# Patient Record
Sex: Female | Born: 2000
Health system: Southern US, Community
[De-identification: ages and names within clinical notes are randomized; demographics above are authoritative.]

## PROBLEM LIST (undated history)

## (undated) DIAGNOSIS — J45909 Unspecified asthma, uncomplicated: Secondary | ICD-10-CM

---
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
## 2022-04-22 IMAGING — US US ABDOMEN COMPLETE
1 series · 14 of 25 positions shown · non-contrast
Comparison: None.

CLINICAL DATA: Back pain, nausea and vomiting.

EXAM:
ABDOMEN ULTRASOUND COMPLETE

[Series 1: us abdomen complete · 14 of 62 slices shown]
[im 1/62]
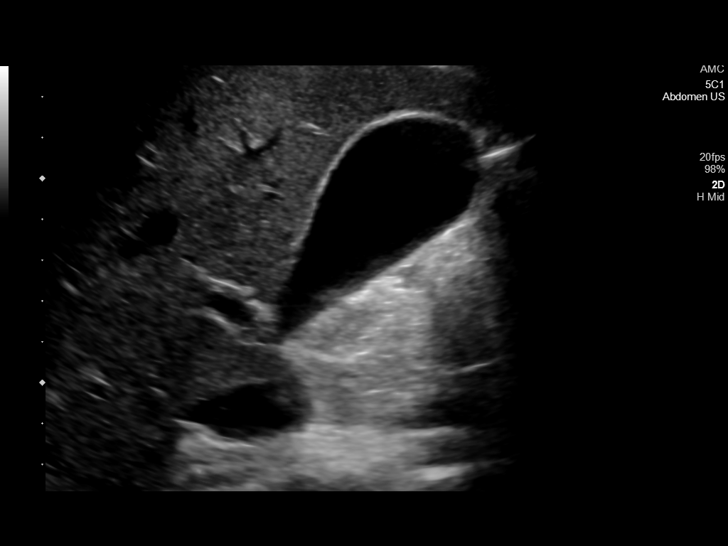
[im 6/62]
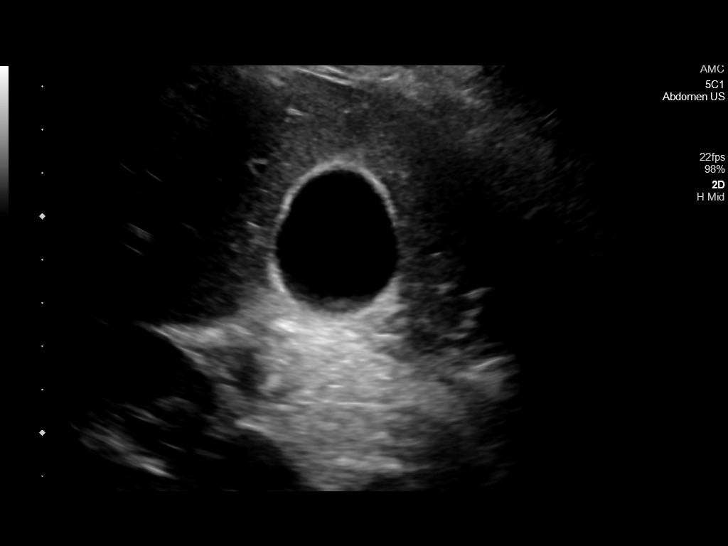
[im 11/62]
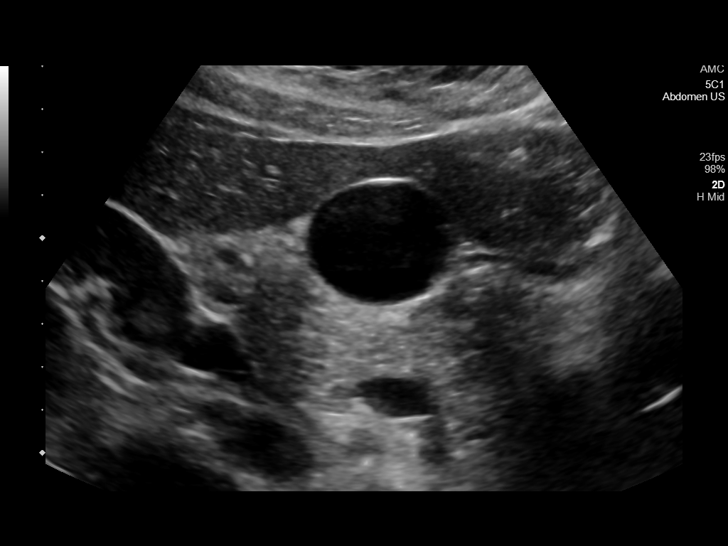
[im 16/62]
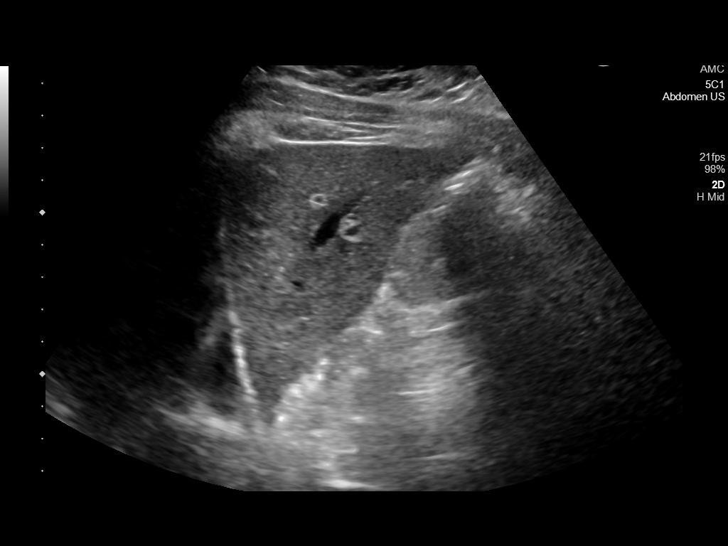
[im 21/62]
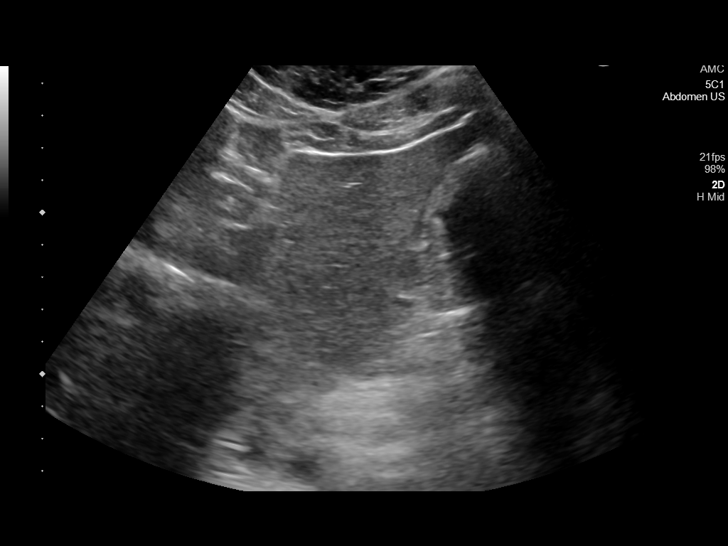
[im 23/62]
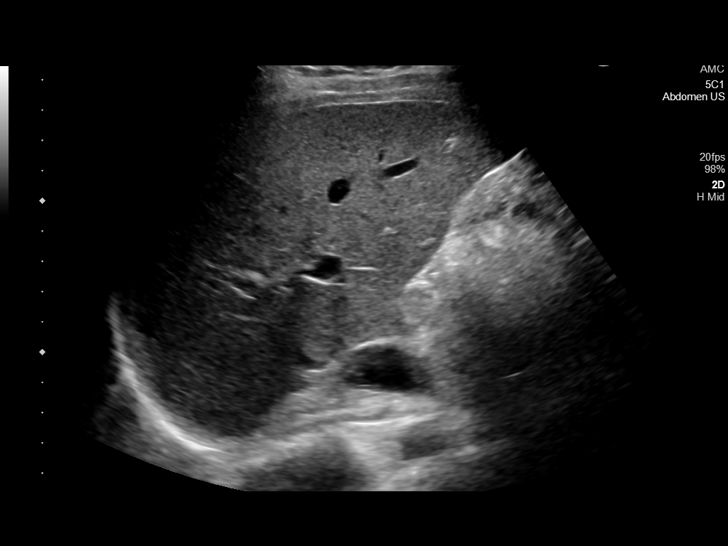
[im 28/62]
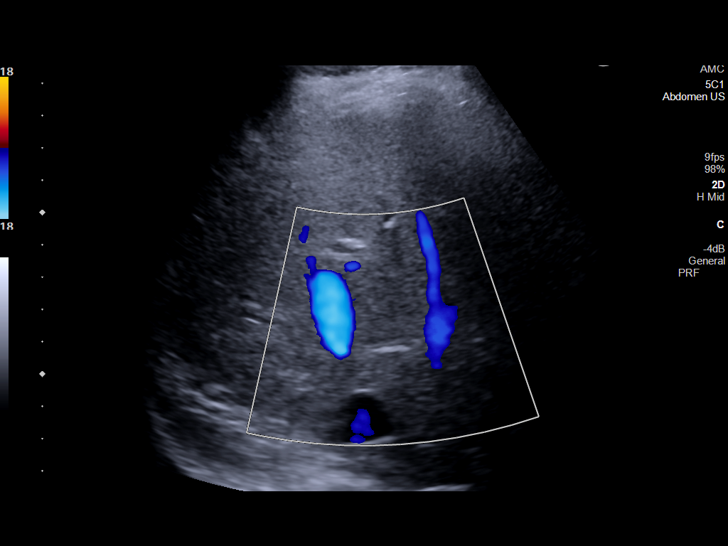
[im 34/62]
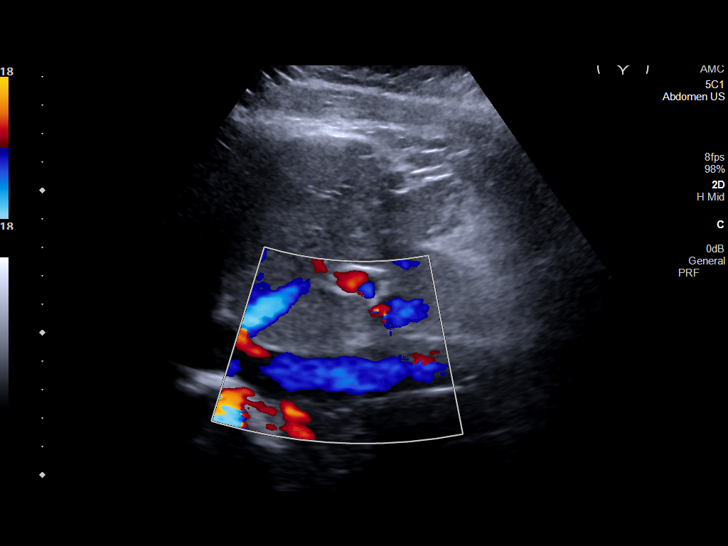
[im 39/62]
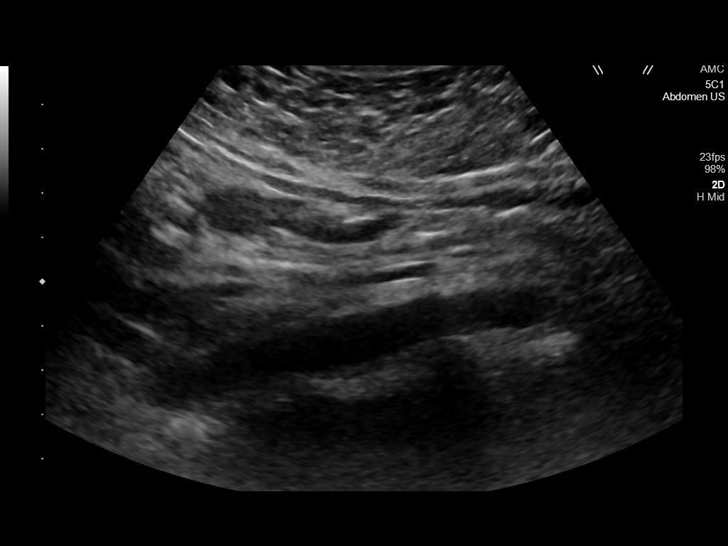
[im 41/62]
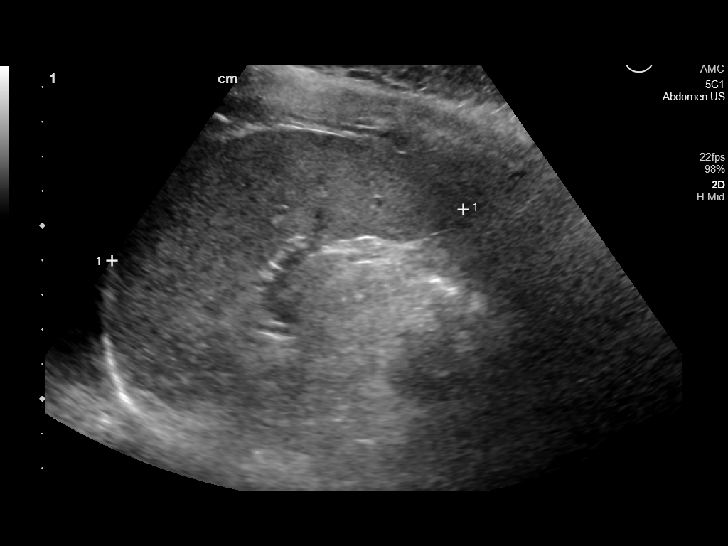
[im 46/62]
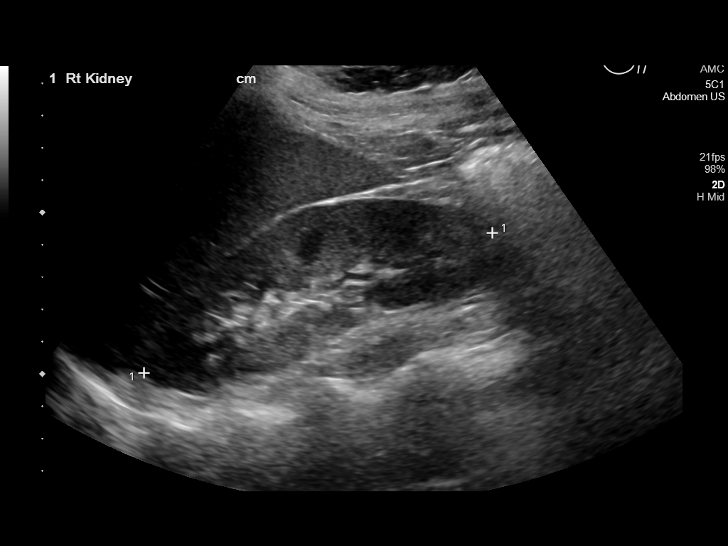
[im 51/62]
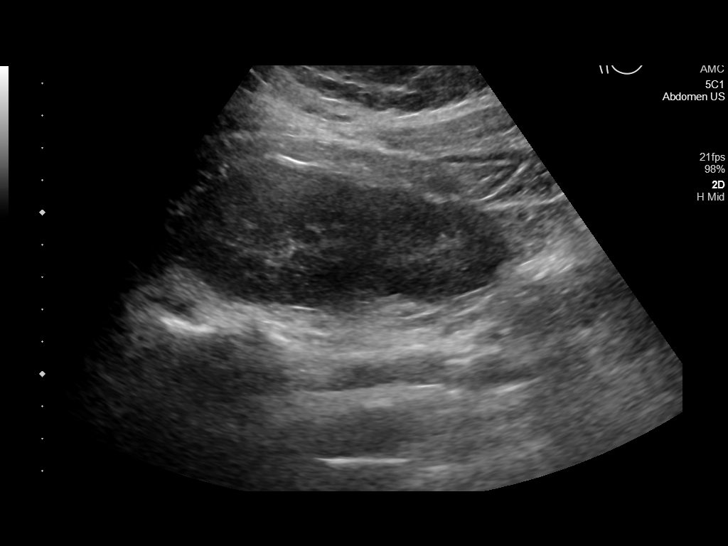
[im 56/62]
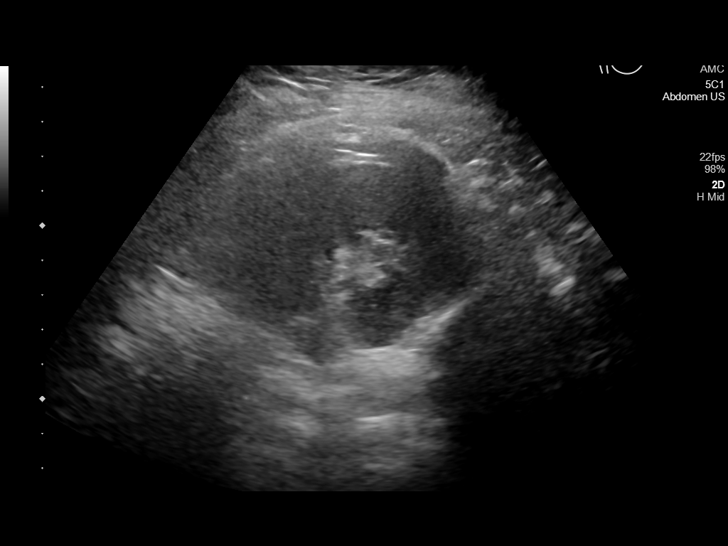
[im 62/62]
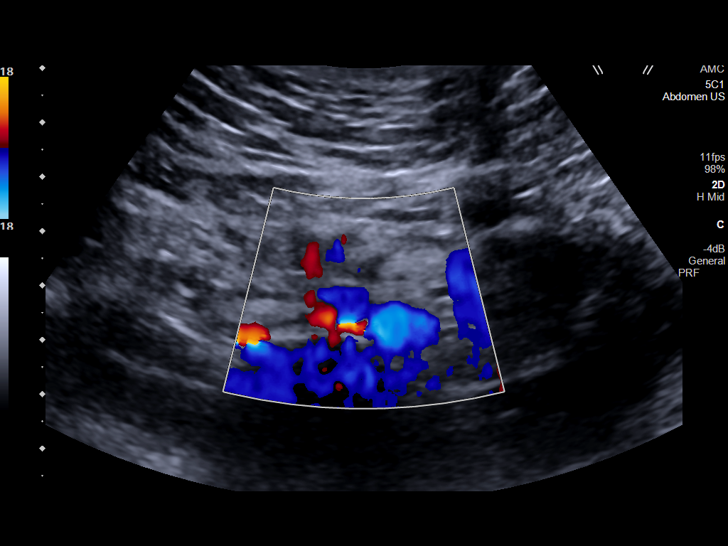

[14 of 25 positions shown; findings below may reference images not displayed]

FINDINGS: Gallbladder: No gallstones or wall thickening visualized. No
sonographic Murphy sign noted by sonographer.

Common bile duct: Diameter: 3 mm

Liver: No focal lesion identified. Within normal limits in
parenchymal echogenicity. Portal vein is patent on color Doppler
imaging with normal direction of blood flow towards the liver.

IVC: No abnormality visualized.

Pancreas: Visualized portion unremarkable.

Spleen: Size and appearance within normal limits.

Right Kidney: Length: 11.6 cm. Echogenicity within normal limits. No
mass or hydronephrosis visualized.

Left Kidney: Length: 10.8 cm. Echogenicity within normal limits. No
mass or hydronephrosis visualized.

Abdominal aorta: No aneurysm visualized.

Other findings: None.
IMPRESSION: Normal abdomen ultrasound.

## 2022-10-27 ENCOUNTER — Emergency Department
Admission: EM | Admit: 2022-10-27 | Discharge: 2022-10-28 | Disposition: A | Discharge status: Home / Self Care | Payer: 59 | Attending: Emergency Medicine | Admitting: Emergency Medicine

## 2022-10-27 ENCOUNTER — Encounter: Payer: Self-pay | Admitting: Emergency Medicine

## 2022-10-27 ENCOUNTER — Other Ambulatory Visit: Payer: Self-pay

## 2022-10-27 DIAGNOSIS — G8929 Other chronic pain: Secondary | ICD-10-CM | POA: Diagnosis not present

## 2022-10-27 DIAGNOSIS — R109 Unspecified abdominal pain: Secondary | ICD-10-CM | POA: Insufficient documentation

## 2022-10-27 DIAGNOSIS — R197 Diarrhea, unspecified: Secondary | ICD-10-CM | POA: Diagnosis not present

## 2022-10-27 DIAGNOSIS — R112 Nausea with vomiting, unspecified: Secondary | ICD-10-CM | POA: Insufficient documentation

## 2022-10-27 LAB — CBC
HCT: 42.4 % (ref 36.0–46.0)
Hemoglobin: 14.2 g/dL (ref 12.0–15.0)
MCH: 30.5 pg (ref 26.0–34.0)
MCHC: 33.5 g/dL (ref 30.0–36.0)
MCV: 91.2 fL (ref 80.0–100.0)
Platelets: 172 10*3/uL (ref 150–400)
RBC: 4.65 MIL/uL (ref 3.87–5.11)
RDW: 11.9 % (ref 11.5–15.5)
WBC: 6.5 10*3/uL (ref 4.0–10.5)
nRBC: 0 % (ref 0.0–0.2)

## 2022-10-27 LAB — URINALYSIS, ROUTINE W REFLEX MICROSCOPIC
Bilirubin Urine: NEGATIVE
Glucose, UA: NEGATIVE mg/dL
Hgb urine dipstick: NEGATIVE
Ketones, ur: 5 mg/dL — AB
Leukocytes,Ua: NEGATIVE
Nitrite: NEGATIVE
Protein, ur: 30 mg/dL — AB
Specific Gravity, Urine: 1.028 (ref 1.005–1.030)
pH: 6 (ref 5.0–8.0)

## 2022-10-27 LAB — COMPREHENSIVE METABOLIC PANEL
ALT: 16 U/L (ref 0–44)
AST: 24 U/L (ref 15–41)
Albumin: 4.9 g/dL (ref 3.5–5.0)
Alkaline Phosphatase: 45 U/L (ref 38–126)
Anion gap: 9 (ref 5–15)
BUN: 14 mg/dL (ref 6–20)
CO2: 24 mmol/L (ref 22–32)
Calcium: 9.7 mg/dL (ref 8.9–10.3)
Chloride: 102 mmol/L (ref 98–111)
Creatinine, Ser: 0.6 mg/dL (ref 0.44–1.00)
GFR, Estimated: >60 mL/min (ref >60)
Glucose, Bld: 98 mg/dL (ref 70–99)
Potassium: 3.8 mmol/L (ref 3.5–5.1)
Sodium: 135 mmol/L (ref 135–145)
Total Bilirubin: 1 mg/dL (ref 0.3–1.2)
Total Protein: 8.1 g/dL (ref 6.5–8.1)

## 2022-10-27 LAB — LIPASE, BLOOD: Lipase: 67 U/L — ABNORMAL HIGH (ref 11–51)

## 2022-10-27 LAB — POC URINE PREG, ED: Preg Test, Ur: NEGATIVE

## 2022-10-27 MED ORDER — ONDANSETRON 4 MG PO TBDP: 4.0000 mg | ORAL_TABLET | Freq: Once | ORAL | Status: DC | PRN

## 2022-10-27 NOTE — ED Triage Notes (Signed)
Pt in with LLQ abdominal pain, ongoing x 3 mo's but worsened today. States she also has been having increased episodes of vomiting/diarrhea today. She's scheduled for EDG and colonoscopy on 2/9, but pain is unbearable in the meantime. Reports seeing small amounts of BRB in stool.

## 2022-10-28 MED ORDER — METOCLOPRAMIDE HCL 10 MG PO TABS: 10.0000 mg | ORAL_TABLET | Freq: Three times a day (TID) | ORAL | 0 refills | Status: DC | PRN

## 2022-10-28 MED ORDER — DICYCLOMINE HCL 10 MG PO CAPS
10.0000 mg | ORAL_CAPSULE | Freq: Once | ORAL | Status: AC
  Administered 2022-10-28: 10 mg via ORAL
  Filled 2022-10-28: qty 1

## 2022-10-28 MED ORDER — METOCLOPRAMIDE HCL 10 MG PO TABS
10.0000 mg | ORAL_TABLET | Freq: Once | ORAL | Status: AC
  Administered 2022-10-28: 10 mg via ORAL
  Filled 2022-10-28: qty 1

## 2022-10-28 MED ORDER — DICYCLOMINE HCL 10 MG PO CAPS: 10.0000 mg | ORAL_CAPSULE | Freq: Three times a day (TID) | ORAL | 0 refills | Status: AC | PRN

## 2022-10-28 NOTE — ED Provider Notes (Signed)
[facility] Provider Note    Event Date/Time   First MD Initiated Contact with Patient 10/27/22 2358     (approximate)   History   Abdominal Pain   HPI  Brittany Robinson is a 22 y.o. female with history of chronic abdominal pain who presents for evaluation of worsening episodes of abdominal pain, nausea, vomiting, and diarrhea over the last few days.  She says she has been dealing with this since she was a child.  Her mother is with her at bedside and confirms.  She said that sometimes it just gets to be too much so she came to the emergency department.  She is established care with Dr. Virgina Jock with gastroenterology and has a colonoscopy and endoscopy scheduled in a few weeks.  She is not currently taking any medications other than over-the-counter treatment for her abdominal symptoms.  She is having no pain when she urinates and no chest pain or shortness of breath.  The pain feels similar to the way it always feels, which is both pressure and sharp pain.     Physical Exam   Triage Vital Signs: ED Triage Vitals [10/27/22 2135]  Enc Vitals Group     BP (!) 120/97     Pulse Rate 81     Resp 18     Temp 98.3 F (36.8 C)     Temp Source Oral     SpO2 97 %     Weight 52.6 kg (116 lb)     Height      Head Circumference      Peak Flow      Pain Score 6     Pain Loc      Pain Edu?      Excl. in [place]?     Most recent vital signs: Vitals:   10/27/22 2135  BP: (!) 120/97  Pulse: 81  Resp: 18  Temp: 98.3 F (36.8 C)  SpO2: 97%     General: Awake, no distress.  Appears comfortable. CV:  Good peripheral perfusion.  Regular rate and rhythm. Resp:  Normal effort.  Speaking easily and comfortably, no accessory muscle usage, no intercostal retractions. Abd:  No distention.  No tenderness to palpation throughout the abdomen.  No rebound nor guarding. Other:  Normal mood and affect.   ED Results / Procedures / Treatments   Labs (all labs ordered are  listed, but only abnormal results are displayed) Labs Reviewed  LIPASE, BLOOD - Abnormal; Notable for the following components:      Result Value   Lipase 67 (*)    All other components within normal limits  URINALYSIS, ROUTINE W REFLEX MICROSCOPIC - Abnormal; Notable for the following components:   Color, Urine YELLOW (*)    APPearance HAZY (*)    Ketones, ur 5 (*)    Protein, ur 30 (*)    Bacteria, UA RARE (*)    All other components within normal limits  COMPREHENSIVE METABOLIC PANEL  CBC  POC URINE PREG, ED      PROCEDURES:  Critical Care performed: No  Procedures   MEDICATIONS ORDERED IN ED: Medications  ondansetron (ZOFRAN-ODT) disintegrating tablet 4 mg (has no administration in time range)  dicyclomine (BENTYL) capsule 10 mg (10 mg Oral Given 10/28/22 0106)  metoCLOPramide (REGLAN) tablet 10 mg (10 mg Oral Given 10/28/22 0106)     IMPRESSION / MDM / ASSESSMENT AND PLAN / ED COURSE  I reviewed the triage vital signs and the  nursing notes.                              Differential diagnosis includes, but is not limited to, IBD, IBS, diverticulitis, appendicitis, acute on chronic pain, STD/PID/TOA, renal/ureteral colic, UTI/pyelonephritis.  Patient's presentation is most consistent with acute presentation with potential threat to life or bodily function.  Lab/studies ordered: CMP, CBC, lipase, urinalysis, urine pregnancy test.  Vital signs are stable and within normal limits.  Patient has a very slightly elevated lipase but I suspect this is due to the recent vomiting.  She currently has no tenderness to palpation of the abdomen including the epigastrium.  She has not vomited recently.  No indication for imaging at this time.  Patient does not appear clinically dehydrated nor are labs consistent with this.  I talked with her and her mother about symptomatic treatment and follow-up with GI and they are in agreement.  I gave her a dose of Bentyl 10 mg and Reglan 10 mg  in the emergency department and gave her prescriptions for both to try as an outpatient.  I gave my usual and customary return precautions and they understand and agree with the plan.        FINAL CLINICAL IMPRESSION(S) / ED DIAGNOSES   Final diagnoses:  Chronic abdominal pain  Nausea vomiting and diarrhea     Rx / DC Orders   ED Discharge Orders          Ordered    metoCLOPramide (REGLAN) 10 MG tablet  3 times daily PRN        10/28/22 0138    dicyclomine (BENTYL) 10 MG capsule  3 times daily PRN        10/28/22 0138             Note:  This document was prepared using Dragon voice recognition software and may include unintentional dictation errors.   Hinda Kehr, MD 10/28/22 [phone]

## 2022-10-28 NOTE — Discharge Instructions (Signed)
You have been seen in the Emergency Department (ED) for abdominal pain and related symptoms.  Your evaluation did not identify a clear cause of your symptoms but was generally reassuring.  Please follow up as instructed above regarding today's emergent visit and the symptoms that are bothering you.  Return to the ED if your abdominal pain worsens or fails to improve, you develop bloody vomiting, bloody diarrhea, you are unable to tolerate fluids due to vomiting, fever greater than 101, or other symptoms that concern you.

## 2022-11-05 ENCOUNTER — Other Ambulatory Visit: Payer: Self-pay

## 2022-11-05 ENCOUNTER — Emergency Department: Payer: 59

## 2022-11-05 ENCOUNTER — Emergency Department
Admission: EM | Admit: 2022-11-05 | Discharge: 2022-11-06 | Disposition: A | Discharge status: Home / Self Care | Payer: 59 | Attending: Emergency Medicine | Admitting: Emergency Medicine

## 2022-11-05 DIAGNOSIS — S0003XA Contusion of scalp, initial encounter: Secondary | ICD-10-CM | POA: Insufficient documentation

## 2022-11-05 DIAGNOSIS — M542 Cervicalgia: Secondary | ICD-10-CM | POA: Insufficient documentation

## 2022-11-05 DIAGNOSIS — S62665A Nondisplaced fracture of distal phalanx of left ring finger, initial encounter for closed fracture: Secondary | ICD-10-CM | POA: Insufficient documentation

## 2022-11-05 DIAGNOSIS — S59911A Unspecified injury of right forearm, initial encounter: Secondary | ICD-10-CM | POA: Diagnosis present

## 2022-11-05 DIAGNOSIS — S52121A Displaced fracture of head of right radius, initial encounter for closed fracture: Secondary | ICD-10-CM | POA: Diagnosis not present

## 2022-11-05 DIAGNOSIS — W108XXA Fall (on) (from) other stairs and steps, initial encounter: Secondary | ICD-10-CM | POA: Diagnosis not present

## 2022-11-05 DIAGNOSIS — R55 Syncope and collapse: Secondary | ICD-10-CM | POA: Diagnosis not present

## 2022-11-05 DIAGNOSIS — S0990XA Unspecified injury of head, initial encounter: Secondary | ICD-10-CM

## 2022-11-05 LAB — COMPREHENSIVE METABOLIC PANEL
ALT: 14 U/L (ref 0–44)
AST: 28 U/L (ref 15–41)
Albumin: 4.5 g/dL (ref 3.5–5.0)
Alkaline Phosphatase: 41 U/L (ref 38–126)
Anion gap: 15 (ref 5–15)
BUN: 11 mg/dL (ref 6–20)
CO2: 17 mmol/L — ABNORMAL LOW (ref 22–32)
Calcium: 9.3 mg/dL (ref 8.9–10.3)
Chloride: 106 mmol/L (ref 98–111)
Creatinine, Ser: 0.82 mg/dL (ref 0.44–1.00)
GFR, Estimated: >60 mL/min (ref >60)
Glucose, Bld: 117 mg/dL — ABNORMAL HIGH (ref 70–99)
Potassium: 3.6 mmol/L (ref 3.5–5.1)
Sodium: 138 mmol/L (ref 135–145)
Total Bilirubin: 1.2 mg/dL (ref 0.3–1.2)
Total Protein: 7.6 g/dL (ref 6.5–8.1)

## 2022-11-05 LAB — HCG, QUANTITATIVE, PREGNANCY: hCG, Beta Chain, Quant, S: <1 m[IU]/mL (ref <5)

## 2022-11-05 LAB — CBC
HCT: 47.2 % — ABNORMAL HIGH (ref 36.0–46.0)
Hemoglobin: 15.9 g/dL — ABNORMAL HIGH (ref 12.0–15.0)
MCH: 30.9 pg (ref 26.0–34.0)
MCHC: 33.7 g/dL (ref 30.0–36.0)
MCV: 91.7 fL (ref 80.0–100.0)
Platelets: 264 10*3/uL (ref 150–400)
RBC: 5.15 MIL/uL — ABNORMAL HIGH (ref 3.87–5.11)
RDW: 11.8 % (ref 11.5–15.5)
WBC: 7.4 10*3/uL (ref 4.0–10.5)
nRBC: 0 % (ref 0.0–0.2)

## 2022-11-05 LAB — ETHANOL: Alcohol, Ethyl (B): <10 mg/dL (ref <10)

## 2022-11-05 LAB — PROTIME-INR
INR: 1 (ref 0.8–1.2)
Prothrombin Time: 12.6 seconds (ref 11.4–15.2)

## 2022-11-05 LAB — LACTIC ACID, PLASMA: Lactic Acid, Venous: 3.5 mmol/L (ref 0.5–1.9)

## 2022-11-05 MED ORDER — HYDROCODONE-ACETAMINOPHEN 5-325 MG PO TABS: 1.0000 | ORAL_TABLET | Freq: Four times a day (QID) | ORAL | 0 refills | Status: DC | PRN

## 2022-11-05 MED ORDER — HYDROCODONE-ACETAMINOPHEN 5-325 MG PO TABS
1.0000 | ORAL_TABLET | Freq: Once | ORAL | Status: AC
  Administered 2022-11-05: 1 via ORAL
  Filled 2022-11-05: qty 1

## 2022-11-05 MED ORDER — ONDANSETRON HCL 4 MG/2ML IJ SOLN
4.0000 mg | INTRAMUSCULAR | Status: AC
  Administered 2022-11-05: 4 mg via INTRAVENOUS
  Filled 2022-11-05: qty 2

## 2022-11-05 MED ORDER — SODIUM CHLORIDE 0.9 % IV BOLUS
1000.0000 mL | Freq: Once | INTRAVENOUS | Status: AC
  Administered 2022-11-05: 1000 mL via INTRAVENOUS

## 2022-11-05 MED ORDER — ONDANSETRON 4 MG PO TBDP: 4.0000 mg | ORAL_TABLET | Freq: Four times a day (QID) | ORAL | 0 refills | Status: DC | PRN

## 2022-11-05 MED ORDER — FENTANYL CITRATE PF 50 MCG/ML IJ SOSY
50.0000 ug | PREFILLED_SYRINGE | Freq: Once | INTRAMUSCULAR | Status: AC
  Administered 2022-11-05: 50 ug via INTRAVENOUS
  Filled 2022-11-05: qty 1

## 2022-11-05 NOTE — ED Provider Notes (Signed)
[facility] Provider Note    Event Date/Time   First MD Initiated Contact with Patient 11/05/22 2013     (approximate)   History   Fall and Head Injury   HPI  Brittany Robinson is a 22 y.o. female reports previously healthy except for having loose stools for 2 months currently on treatment for C. difficile and having about 1-2 loose bowel movements daily now for over a month.  She was helping her mother today.  When she stood up after helping her mother she got to the top of her stairway felt lightheaded and then awoke as she fell down the stairs.  She reports she thinks she passed out very briefly and recalls just landing on about the last 1 or 2 steps.  She currently reports pain in her right side of her neck that radiates towards her right shoulder and right upper arm.  No numbness or weakness in the arms or legs though.  She also reports a moderate headache and it is centered over the left front of her scalp where she has some swelling.  She also had a small amount of blood around her lips and thinks she bit her upper lip slightly  No chest pain.  No rib pain.  No abdominal pain.  Denies pregnancy or heavy vaginal bleeding.  She does not have a history of anemia.  She has never passed out before but reports often times when she stands up for at least the last month or 2 she will get lightheaded when she stands up quickly and felt the same symptoms today while at the top of the stairs before she believes she passed out       Physical Exam   Triage Vital Signs: ED Triage Vitals  Enc Vitals Group     BP --      Pulse Rate 11/05/22 2003 74     Resp 11/05/22 2003 18     Temp 11/05/22 2003 98 F (36.7 C)     Temp Source 11/05/22 2003 Oral     SpO2 --      Weight 11/05/22 2004 114 lb 10.2 oz (52 kg)     Height 11/05/22 2004 '5\' 5"'$  (1.651 m)     Head Circumference --      Peak Flow --      Pain Score 11/05/22 2003 10     Pain Loc --      Pain Edu? --       Excl. in [place]? --     Most recent vital signs: Vitals:   11/05/22 2006 11/05/22 2130  BP: (!) 122/90 105/75  Pulse:  72  Resp:  12  Temp:    SpO2: 97% 100%     General: Awake, no distress except she appears in notable pain when she tries to move her right upper arm.  She winces in significant pain during her x-rays and I have ordered pain medicine for her.  Normocephalic atraumatic set for a small abrasion over the front of the scalp but she also has a mild some moderate size left frontal hematoma overlying the frontal scalp.  Normal periorbital and face except for the hematoma over the left scalp.  She has a very small punctate laceration in the inner mucosa of her upper lip that is no longer bleeding.  Does not go through and through and there is no laceration on the actual lip border itself. CV:  Good peripheral perfusion.  Normal  tones and rate. Resp:  Normal effort.  Clear lungs bilaterally Abd:  No distention.  Soft nontender nondistended throughout.  Denies pain to palpation of the rib cage anterior or lateral. Other:   Lower Extremities  No edema. Normal DP/PT pulses bilateral with good cap refill.  Normal neuro-motor function lower extremities bilateral.  RIGHT Right lower extremity demonstrates normal strength, good use of all muscles. No edema bruising or contusions of the right hip, right knee, right ankle. Full range of motion of the right lower extremity without pain. No pain on axial loading. No evidence of trauma.  LEFT Left lower extremity demonstrates normal strength, good use of all muscles. No edema bruising or contusions of the hip,  knee, ankle. Full range of motion of the left lower extremity without pain. No pain on axial loading. No evidence of trauma.  RIGHT Right upper extremity demonstrates limitation movement due to pain in the right elbow arm. No edema bruising or contusions of the right shoulder/upper arm, right elbow, right forearm / hand there is  no obvious deformity but she does report tenderness primarily along the proximal radial and ulnar region as well as some at the right hand and wrist but again no deformity or obvious swelling.  No pain to palpation across the upper chest, clavicle, or along the length of the humerus.  Strong radial pulse. Intact median/ulnar/radial neuro-muscular exam.  LEFT Left upper extremity demonstrates normal strength, good use of all muscles. No edema bruising or contusions of the left shoulder/upper arm, left elbow, left forearm / hand. Full range of motion of the left  upper extremity without pain. No evidence of trauma. Strong radial pulse. Intact median/ulnar/radial neuro-muscular exam.  Cervical collar in place.  ED Results / Procedures / Treatments   Labs (all labs ordered are listed, but only abnormal results are displayed) Labs Reviewed  COMPREHENSIVE METABOLIC PANEL - Abnormal; Notable for the following components:      Result Value   CO2 17 (*)    Glucose, Bld 117 (*)    All other components within normal limits  LACTIC ACID, PLASMA - Abnormal; Notable for the following components:   Lactic Acid, Venous 3.5 (*)    All other components within normal limits  CBC - Abnormal; Notable for the following components:   RBC 5.15 (*)    Hemoglobin 15.9 (*)    HCT 47.2 (*)    All other components within normal limits  PROTIME-INR  ETHANOL  HCG, QUANTITATIVE, PREGNANCY  URINALYSIS, ROUTINE W REFLEX MICROSCOPIC  LACTIC ACID, PLASMA  BASIC METABOLIC PANEL  SAMPLE TO BLOOD BANK     EKG  Interpreted by me at 2135 heart rate 70 QRS 95 QTc 430 Low amplitude P waves are present normal sinus rhythm no evidence of acute ischemia or ectopy   RADIOLOGY  Imaging reviewed, right radial head fracture interpreted by me  CT CERVICAL SPINE WO CONTRAST  Result Date: 11/05/2022 CLINICAL DATA:  Polytrauma, blunt Fall down stairs EXAM: CT CERVICAL SPINE WITHOUT CONTRAST TECHNIQUE: Multidetector CT  imaging of the cervical spine was performed without intravenous contrast. Multiplanar CT image reconstructions were also generated. RADIATION DOSE REDUCTION: This exam was performed according to the departmental dose-optimization program which includes automated exposure control, adjustment of the mA and/or kV according to patient size and/or use of iterative reconstruction technique. COMPARISON:  None Available. FINDINGS: Alignment: Slight broad-based levo scoliotic curvature. Normal cervical lordosis. No traumatic subluxation. Skull base and vertebrae: No acute fracture. Vertebral body heights are  maintained. The dens and skull base are intact. Soft tissues and spinal canal: No prevertebral fluid or swelling. No visible canal hematoma. Disc levels:  Preserved. Upper chest: No acute or unexpected findings. Other: None. IMPRESSION: No fracture or traumatic subluxation of the cervical spine. Electronically Signed   By: Keith Rake M.D.   On: 11/05/2022 21:31   CT HEAD WO CONTRAST  Result Date: 11/05/2022 CLINICAL DATA:  Head trauma, moderate-severe Fall down stairs. Forehead hematoma. EXAM: CT HEAD WITHOUT CONTRAST TECHNIQUE: Contiguous axial images were obtained from the base of the skull through the vertex without intravenous contrast. RADIATION DOSE REDUCTION: This exam was performed according to the departmental dose-optimization program which includes automated exposure control, adjustment of the mA and/or kV according to patient size and/or use of iterative reconstruction technique. COMPARISON:  None Available. FINDINGS: Brain: No intracranial hemorrhage, mass effect, or midline shift. No hydrocephalus. The basilar cisterns are patent. No evidence of territorial infarct or acute ischemia. No extra-axial or intracranial fluid collection. Vascular: No hyperdense vessel or unexpected calcification. Skull: No fracture or focal lesion. Sinuses/Orbits: Paranasal sinuses and mastoid air cells are clear. The  visualized orbits are unremarkable. Other: Frontal scalp hematoma. IMPRESSION: Frontal scalp hematoma. No acute intracranial abnormality. No skull fracture. Electronically Signed   By: Keith Rake M.D.   On: 11/05/2022 21:28   DG Shoulder Right Port  Result Date: 11/05/2022 CLINICAL DATA:  Recent fall downstairs with right shoulder pain, initial encounter EXAM: RIGHT SHOULDER - 3 VIEW COMPARISON:  None Available. FINDINGS: There is no evidence of fracture or dislocation. There is no evidence of arthropathy or other focal bone abnormality. Soft tissues are unremarkable. IMPRESSION: No acute abnormality noted. Electronically Signed   By: Inez Catalina M.D.   On: 11/05/2022 20:48   DG Hand Complete Left  Result Date: 11/05/2022 CLINICAL DATA:  Recent fall downstairs with hand pain, initial encounter EXAM: LEFT HAND - COMPLETE 3+ VIEW COMPARISON:  None Available. FINDINGS: Linear fracture is noted through the phalangeal tuft of fourth distal phalanx without significant displacement. No other fracture is seen. No soft tissue abnormality is noted. IMPRESSION: Fourth distal phalangeal tuft fracture as described. Electronically Signed   By: Inez Catalina M.D.   On: 11/05/2022 20:44   DG Forearm Right  Result Date: 11/05/2022 CLINICAL DATA:  Recent fall downstairs with forearm pain, initial encounter EXAM: RIGHT FOREARM - 2 VIEW COMPARISON:  None Available. FINDINGS: Right elbow joint effusion is noted anteriorly similar to that seen on prior elbow film. The known radial head fracture is not well appreciated on this exam. No other fractures are seen. IMPRESSION: Right elbow joint effusion similar to that seen on prior elbow film. No other fractures are noted. Electronically Signed   By: Inez Catalina M.D.   On: 11/05/2022 20:43   DG Elbow Complete Right  Result Date: 11/05/2022 CLINICAL DATA:  Recent fall downstairs with right elbow pain, initial encounter EXAM: RIGHT ELBOW - COMPLETE 3+ VIEW COMPARISON:   None Available. FINDINGS: There is a right elbow joint effusion identified. Minimal cortical irregularity is noted in the radial head consistent with a minimally displaced fracture. No other fracture is seen. IMPRESSION: Minimally displaced radial head fracture with associated joint effusion. Electronically Signed   By: Inez Catalina M.D.   On: 11/05/2022 20:42   DG Chest Port 1 View  Result Date: 11/05/2022 CLINICAL DATA:  Fall downstairs with chest pain, initial encounter EXAM: PORTABLE CHEST 1 VIEW COMPARISON:  03/16/2019 FINDINGS: The heart size  and mediastinal contours are within normal limits. Both lungs are clear. The visualized skeletal structures are unremarkable. IMPRESSION: No active disease. Electronically Signed   By: Inez Catalina M.D.   On: 11/05/2022 20:41      PROCEDURES:  Critical Care performed: No  Procedures   MEDICATIONS ORDERED IN ED: Medications  fentaNYL (SUBLIMAZE) injection 50 mcg (50 mcg Intravenous Given 11/05/22 2036)  sodium chloride 0.9 % bolus 1,000 mL (1,000 mLs Intravenous New Bag/Given 11/05/22 2143)  HYDROcodone-acetaminophen (NORCO/VICODIN) 5-325 MG per tablet 1 tablet (1 tablet Oral Given 11/05/22 2246)  ondansetron (ZOFRAN) injection 4 mg (4 mg Intravenous Given 11/05/22 2332)     IMPRESSION / MDM / ASSESSMENT AND PLAN / ED COURSE  I reviewed the triage vital signs and the nursing notes.                              Differential diagnosis includes, but is not limited to, possible causation's of syncope are considered such as dehydration, electrolyte abnormality, arrhythmia, anemia, metabolic causation's etc.  No chest pain no clinical signs or symptoms to be suggestive of DVT or PE.  Suspect possible dehydration she has been treated for C. difficile recently and reports she has occasional loose stool but no fever no abdominal pain reports her symptoms of loose stool are improving with treatment.  She has no abdominal pain on exam.  Additionally she has  injuries from the fall.  She has a hematoma on her forehead, CT of the head negative for acute intracranial process.  Cervical spine imaging negative for fracture.  She does have a small radial head fracture on the right elbow and a left finger fourth digit fracture.  None of these seem highly displaced or in need of reduction.  Will buddy tape fourth digit of the left hand.   Patient's presentation is most consistent with acute presentation with potential threat to life or bodily function.   The patient is on the cardiac monitor to evaluate for evidence of arrhythmia and/or significant heart rate changes.  Patient's labs are notable for reassuring CBC without acute anemia.  Might even be slightly hemoconcentrated.  Labs demonstrated reduced CO2 and elevated lactic acid, suspect the lactic acid is elevated in the setting of injury and trauma but also suspect she may have some element of dehydration as she reports orthostatic like symptoms for least a couple weeks time with standing  Clinical Course as of 11/05/22 2328  Fri Nov 05, 2022  2235 Dr. Sabra Heck reviewed imaging, advised appropriate to sling and sugar tong to right arm for radial head [MQ]  2323 Warm well-perfused right hand patient reports pain improved after splinting.  Resting comfortably.  Good neurovascular examination right hand.  Comfortable with plan for follow-up with orthopedics, careful return precautions.  Plan to send a prescription for hydrocodone to her pharmacy.  Discussed careful use and not to drive while taking it [MQ]    Clinical Course User Index [MQ] Delman Kitten, MD   ----------------------------------------- 11:37 PM on 11/05/2022 -----------------------------------------  Ongoing care send Dr. Archie Balboa, follow-up on repeat lactic acid and metabolic panel.  If improving anticipate discharge to home following up with orthopedics as well as her primary care doctor for what appears to be syncope possibly related to mild  dehydration and orthostatic symptoms in the setting of C. difficile which seems to be improving.  Injuries include right radial head fracture, splinted with plan follow-up with Dr. Sabra Heck  Return precautions and treatment recommendations and follow-up discussed with the patient who is agreeable with the plan.   FINAL CLINICAL IMPRESSION(S) / ED DIAGNOSES   Final diagnoses:  Closed displaced fracture of head of right radius, initial encounter  Closed nondisplaced fracture of distal phalanx of left ring finger, initial encounter  Syncope and collapse  Closed head injury, initial encounter     Rx / DC Orders   ED Discharge Orders          Ordered    HYDROcodone-acetaminophen (NORCO/VICODIN) 5-325 MG tablet  Every 6 hours PRN        11/05/22 2328    ondansetron (ZOFRAN-ODT) 4 MG disintegrating tablet  Every 6 hours PRN        11/05/22 2328             Note:  This document was prepared using Dragon voice recognition software and may include unintentional dictation errors.   Delman Kitten, MD 11/05/22 [phone]

## 2022-11-05 NOTE — ED Notes (Signed)
Pt states she urinated on herself when she found herself at the bottom of the stairs.

## 2022-11-05 NOTE — ED Triage Notes (Signed)
Pt to ED from home. Pt fell down 5-6 stairs. She got dizzy and fell down the stairs. Pt denies LOC at the time. Pt has large hematoma to forehead. Pain in right arm. Left fingers are painful as well. Pt placed in Ccollar at this time for safety.

## 2022-11-06 LAB — BASIC METABOLIC PANEL
Anion gap: 8 (ref 5–15)
BUN: 10 mg/dL (ref 6–20)
CO2: 24 mmol/L (ref 22–32)
Calcium: 8.8 mg/dL — ABNORMAL LOW (ref 8.9–10.3)
Chloride: 108 mmol/L (ref 98–111)
Creatinine, Ser: 0.68 mg/dL (ref 0.44–1.00)
GFR, Estimated: >60 mL/min (ref >60)
Glucose, Bld: 111 mg/dL — ABNORMAL HIGH (ref 70–99)
Potassium: 3.9 mmol/L (ref 3.5–5.1)
Sodium: 140 mmol/L (ref 135–145)

## 2022-11-06 LAB — LACTIC ACID, PLASMA: Lactic Acid, Venous: 1.4 mmol/L (ref 0.5–1.9)

## 2023-05-30 ENCOUNTER — Encounter: Admission: RE | Payer: Self-pay | Source: Home / Self Care

## 2023-05-30 ENCOUNTER — Ambulatory Visit: Admission: RE | Admit: 2023-05-30 | Payer: 59 | Source: Home / Self Care | Admitting: Gastroenterology

## 2023-05-30 SURGERY — COLONOSCOPY WITH PROPOFOL
Anesthesia: General

## 2023-06-10 ENCOUNTER — Other Ambulatory Visit: Payer: Self-pay

## 2023-06-10 ENCOUNTER — Emergency Department
Admission: EM | Admit: 2023-06-10 | Discharge: 2023-06-10 | Disposition: A | Discharge status: Home / Self Care | Payer: 59 | Attending: Emergency Medicine | Admitting: Emergency Medicine

## 2023-06-10 ENCOUNTER — Emergency Department: Payer: 59

## 2023-06-10 DIAGNOSIS — R110 Nausea: Secondary | ICD-10-CM | POA: Diagnosis present

## 2023-06-10 DIAGNOSIS — R1013 Epigastric pain: Secondary | ICD-10-CM | POA: Diagnosis not present

## 2023-06-10 LAB — CBC
HCT: 45.7 % (ref 36.0–46.0)
Hemoglobin: 15.5 g/dL — ABNORMAL HIGH (ref 12.0–15.0)
MCH: 31 pg (ref 26.0–34.0)
MCHC: 33.9 g/dL (ref 30.0–36.0)
MCV: 91.4 fL (ref 80.0–100.0)
Platelets: 250 10*3/uL (ref 150–400)
RBC: 5 MIL/uL (ref 3.87–5.11)
RDW: 11.9 % (ref 11.5–15.5)
WBC: 5.4 10*3/uL (ref 4.0–10.5)
nRBC: 0 % (ref 0.0–0.2)

## 2023-06-10 LAB — URINALYSIS, ROUTINE W REFLEX MICROSCOPIC
Bilirubin Urine: NEGATIVE
Glucose, UA: NEGATIVE mg/dL
Hgb urine dipstick: NEGATIVE
Ketones, ur: 20 mg/dL — AB
Leukocytes,Ua: NEGATIVE
Nitrite: NEGATIVE
Protein, ur: NEGATIVE mg/dL
Specific Gravity, Urine: 1.025 (ref 1.005–1.030)
pH: 5 (ref 5.0–8.0)

## 2023-06-10 LAB — COMPREHENSIVE METABOLIC PANEL
ALT: 17 U/L (ref 0–44)
AST: 24 U/L (ref 15–41)
Albumin: 4.6 g/dL (ref 3.5–5.0)
Alkaline Phosphatase: 51 U/L (ref 38–126)
Anion gap: 14 (ref 5–15)
BUN: 11 mg/dL (ref 6–20)
CO2: 20 mmol/L — ABNORMAL LOW (ref 22–32)
Calcium: 9.5 mg/dL (ref 8.9–10.3)
Chloride: 107 mmol/L (ref 98–111)
Creatinine, Ser: 0.73 mg/dL (ref 0.44–1.00)
GFR, Estimated: >60 mL/min (ref >60)
Glucose, Bld: 104 mg/dL — ABNORMAL HIGH (ref 70–99)
Potassium: 3.3 mmol/L — ABNORMAL LOW (ref 3.5–5.1)
Sodium: 141 mmol/L (ref 135–145)
Total Bilirubin: 1.6 mg/dL — ABNORMAL HIGH (ref 0.3–1.2)
Total Protein: 7.9 g/dL (ref 6.5–8.1)

## 2023-06-10 LAB — POC URINE PREG, ED: Preg Test, Ur: NEGATIVE

## 2023-06-10 LAB — LIPASE, BLOOD: Lipase: 49 U/L (ref 11–51)

## 2023-06-10 MED ORDER — IOHEXOL 300 MG/ML  SOLN
80.0000 mL | Freq: Once | INTRAMUSCULAR | Status: AC | PRN
  Administered 2023-06-10: 80 mL via INTRAVENOUS

## 2023-06-10 MED ORDER — FAMOTIDINE IN NACL 20-0.9 MG/50ML-% IV SOLN
20.0000 mg | Freq: Once | INTRAVENOUS | Status: AC
  Administered 2023-06-10: 20 mg via INTRAVENOUS
  Filled 2023-06-10: qty 50

## 2023-06-10 MED ORDER — METOCLOPRAMIDE HCL 10 MG PO TABS: 10.0000 mg | ORAL_TABLET | Freq: Three times a day (TID) | ORAL | 0 refills | Status: DC | PRN

## 2023-06-10 MED ORDER — SODIUM CHLORIDE 0.9 % IV BOLUS
1000.0000 mL | Freq: Once | INTRAVENOUS | Status: AC
  Administered 2023-06-10: 1000 mL via INTRAVENOUS

## 2023-06-10 MED ORDER — ONDANSETRON HCL 4 MG/2ML IJ SOLN
4.0000 mg | Freq: Once | INTRAMUSCULAR | Status: AC
  Administered 2023-06-10: 4 mg via INTRAVENOUS
  Filled 2023-06-10: qty 2

## 2023-06-10 NOTE — ED Notes (Signed)
Patient is back from CT scan. States she feels better.

## 2023-06-10 NOTE — ED Provider Notes (Signed)
[facility] Provider Note    Event Date/Time   First MD Initiated Contact with Patient 06/10/23 1101     (approximate)   History   Abdominal Pain and Emesis   HPI  Brittany Robinson is a 22 y.o. female with history of C. difficile and chronic diarrhea who presents with abdominal pain, nausea, and persistent loose stools.  The patient states that she has had chronic abdominal pain mainly in the left lower quadrant.  However, over the last several days she has started to have more intense epigastric pain which is new for her.  She also reports increased nausea and has not been able to eat or drink much although has not vomited.  She also reports dark stools which sometimes appear black although states that this has been present for about a month and has not acutely changed.  She has not seen any actual blood in her stool.  She is scheduled for an upper endoscopy and colonoscopy next week.  I reviewed the past medical records.  The patient was most recently evaluated by gastroenterology at [facility] on 8/12 for follow-up of her persistent symptoms of nausea, vomiting, and diarrhea.  She is scheduled for a colonoscopy and upper endoscopy.   Physical Exam   Triage Vital Signs: ED Triage Vitals  Encounter Vitals Group     BP 06/10/23 0918 106/79     Systolic BP Percentile --      Diastolic BP Percentile --      Pulse Rate 06/10/23 0918 (!) 122     Resp 06/10/23 0918 18     Temp 06/10/23 0918 98.3 F (36.8 C)     Temp Source 06/10/23 0918 Oral     SpO2 06/10/23 0918 95 %     Weight 06/10/23 0919 116 lb (52.6 kg)     Height 06/10/23 0919 5\' 5"  (1.651 m)     Head Circumference --      Peak Flow --      Pain Score 06/10/23 0919 8     Pain Loc --      Pain Education --      Exclude from Growth Chart --     Most recent vital signs: Vitals:   06/10/23 0918  BP: 106/79  Pulse: (!) 122  Resp: 18  Temp: 98.3 F (36.8 C)  SpO2: 95%      General: Awake, no distress.  CV:  Good peripheral perfusion.  Resp:  Normal effort.  Abd:  Soft with mild epigastric tenderness.  No distention.  Other:  No jaundice or scleral icterus.   ED Results / Procedures / Treatments   Labs (all labs ordered are listed, but only abnormal results are displayed) Labs Reviewed  COMPREHENSIVE METABOLIC PANEL - Abnormal; Notable for the following components:      Result Value   Potassium 3.3 (*)    CO2 20 (*)    Glucose, Bld 104 (*)    Total Bilirubin 1.6 (*)    All other components within normal limits  CBC - Abnormal; Notable for the following components:   Hemoglobin 15.5 (*)    All other components within normal limits  URINALYSIS, ROUTINE W REFLEX MICROSCOPIC - Abnormal; Notable for the following components:   Color, Urine YELLOW (*)    APPearance HAZY (*)    Ketones, ur 20 (*)    All other components within normal limits  LIPASE, BLOOD  POC URINE PREG, ED     EKG  RADIOLOGY  CT abdomen/pelvis: I independently viewed and interpreted the images; there are no dilated bowel loops or any free air or free fluid.  Radiology report indicates no acute abnormality   PROCEDURES:  Critical Care performed: No  Procedures   MEDICATIONS ORDERED IN ED: Medications  sodium chloride 0.9 % bolus 1,000 mL (0 mLs Intravenous Stopped 06/10/23 1337)  famotidine (PEPCID) IVPB 20 mg premix (0 mg Intravenous Stopped 06/10/23 1307)  ondansetron (ZOFRAN) injection 4 mg (4 mg Intravenous Given 06/10/23 1147)  iohexol (OMNIPAQUE) 300 MG/ML solution 80 mL (80 mLs Intravenous Contrast Given 06/10/23 1159)     IMPRESSION / MDM / ASSESSMENT AND PLAN / ED COURSE  I reviewed the triage vital signs and the nursing notes.  22 year old female with PMH as noted above, currently followed by GI for chronic diarrhea and abdominal pain presents with acutely worsened epigastric abdominal pain which is changed from her chronic symptoms, worsened  nausea and persistent loose stools which are dark.  On exam she is overall well-appearing.  She was tachycardic at triage with otherwise normal vital signs.  Abdomen is soft with mild epigastric tenderness.  Differential diagnosis includes, but is not limited to, gastritis, PUD, gastroparesis, colitis, diverticulitis, pancreatitis, other hepatobiliary cause.  Patient's presentation is most consistent with acute complicated illness / injury requiring diagnostic workup.  Lab workup was obtained.  LFTs are normal.  Lipase is normal.  There is no leukocytosis.  Hemoglobin is normal.  Although the patient describes dark stools given that this is a chronic symptom and her hemoglobin is normal (as well as the fact that she is getting a colonoscopy next week) there is no indication for rectal exam or stool sampling here; the patient agrees.  Since her abdominal pain has acutely changed in location and intensity, and based on shared decision making with the patient, we will obtain a CT to rule out acute etiologies.  If this is negative anticipate discharge home with continued outpatient GI follow-up.  ----------------------------------------- 1:44 PM on 06/10/2023 -----------------------------------------  CT shows no acute findings.  The patient is feeling better after Pepcid and Zofran.  She is stable for discharge home.  I counseled her on the results of the workup and plan of care.  She should follow-up for her colonoscopy next week as scheduled.  I have prescribed Reglan and instructed her to take over-the-counter Pepcid for the next few days as well.  Return precautions given, and she expresses understanding.   FINAL CLINICAL IMPRESSION(S) / ED DIAGNOSES   Final diagnoses:  Nausea  Epigastric abdominal pain     Rx / DC Orders   ED Discharge Orders          Ordered    metoCLOPramide (REGLAN) 10 MG tablet  Every 8 hours PRN        06/10/23 1343             Note:  This document was  prepared using Dragon voice recognition software and may include unintentional dictation errors.    Dionne Bucy, MD 06/10/23 1344

## 2023-06-10 NOTE — ED Notes (Signed)
Patient states she has intermittent diarrhea, though today's BM was formed and soft. Patient states color of BM has changed to a "blackish-gray" in the last month.

## 2023-06-10 NOTE — ED Triage Notes (Addendum)
Pt presents to ED with c/o of ABD pain and emesis, pt states this has been ongoing for a year, pt states colonoscopy and endoscopy scheduled for Wednesday but was told to come here by PCP.   Pt states HX of h.pylori.

## 2023-06-10 NOTE — Discharge Instructions (Signed)
Follow-up next week for your upper endoscopy and colonoscopy as scheduled.  You should take Pepcid Francies famotidine) which is available over-the-counter, 20 mg twice daily.  You may take the Reglan prescribed as needed for nausea.  Return to the ER for new, worsening, or persistent severe abdominal pain, vomiting, inability to hold anything down, weakness or lightheadedness, or any other new or worsening symptoms that concern you.

## 2023-06-15 ENCOUNTER — Ambulatory Visit: Payer: 59

## 2023-06-15 DIAGNOSIS — R197 Diarrhea, unspecified: Secondary | ICD-10-CM | POA: Diagnosis not present

## 2023-06-15 DIAGNOSIS — R6881 Early satiety: Secondary | ICD-10-CM | POA: Diagnosis not present

## 2023-06-15 DIAGNOSIS — R634 Abnormal weight loss: Secondary | ICD-10-CM | POA: Diagnosis not present

## 2023-06-15 DIAGNOSIS — R110 Nausea: Secondary | ICD-10-CM | POA: Diagnosis not present

## 2023-06-15 DIAGNOSIS — R109 Unspecified abdominal pain: Secondary | ICD-10-CM | POA: Diagnosis present

## 2023-06-25 ENCOUNTER — Emergency Department: Payer: 59

## 2023-06-25 ENCOUNTER — Emergency Department
Admission: EM | Admit: 2023-06-25 | Discharge: 2023-06-25 | Disposition: A | Discharge status: Home / Self Care | Payer: 59 | Attending: Emergency Medicine | Admitting: Emergency Medicine

## 2023-06-25 ENCOUNTER — Other Ambulatory Visit: Payer: Self-pay

## 2023-06-25 DIAGNOSIS — Z1152 Encounter for screening for COVID-19: Secondary | ICD-10-CM | POA: Insufficient documentation

## 2023-06-25 DIAGNOSIS — R079 Chest pain, unspecified: Secondary | ICD-10-CM | POA: Diagnosis present

## 2023-06-25 DIAGNOSIS — R5383 Other fatigue: Secondary | ICD-10-CM | POA: Diagnosis not present

## 2023-06-25 LAB — CBC WITH DIFFERENTIAL/PLATELET
Abs Immature Granulocytes: 0.03 10*3/uL (ref 0.00–0.07)
Basophils Absolute: 0 10*3/uL (ref 0.0–0.1)
Basophils Relative: 0 %
Eosinophils Absolute: 0.1 10*3/uL (ref 0.0–0.5)
Eosinophils Relative: 1 %
HCT: 43.6 % (ref 36.0–46.0)
Hemoglobin: 14.5 g/dL (ref 12.0–15.0)
Immature Granulocytes: 0 %
Lymphocytes Relative: 24 %
Lymphs Abs: 1.7 10*3/uL (ref 0.7–4.0)
MCH: 31.1 pg (ref 26.0–34.0)
MCHC: 33.3 g/dL (ref 30.0–36.0)
MCV: 93.6 fL (ref 80.0–100.0)
Monocytes Absolute: 0.7 10*3/uL (ref 0.1–1.0)
Monocytes Relative: 9 %
Neutro Abs: 4.7 10*3/uL (ref 1.7–7.7)
Neutrophils Relative %: 66 %
Platelets: 241 10*3/uL (ref 150–400)
RBC: 4.66 MIL/uL (ref 3.87–5.11)
RDW: 12 % (ref 11.5–15.5)
WBC: 7.2 10*3/uL (ref 4.0–10.5)
nRBC: 0 % (ref 0.0–0.2)

## 2023-06-25 LAB — BASIC METABOLIC PANEL
Anion gap: 9 (ref 5–15)
BUN: 11 mg/dL (ref 6–20)
CO2: 25 mmol/L (ref 22–32)
Calcium: 9.4 mg/dL (ref 8.9–10.3)
Chloride: 102 mmol/L (ref 98–111)
Creatinine, Ser: 0.67 mg/dL (ref 0.44–1.00)
GFR, Estimated: >60 mL/min (ref >60)
Glucose, Bld: 130 mg/dL — ABNORMAL HIGH (ref 70–99)
Potassium: 3.4 mmol/L — ABNORMAL LOW (ref 3.5–5.1)
Sodium: 136 mmol/L (ref 135–145)

## 2023-06-25 LAB — POC URINE PREG, ED: Preg Test, Ur: NEGATIVE

## 2023-06-25 LAB — RESP PANEL BY RT-PCR (RSV, FLU A&B, COVID)  RVPGX2
Influenza A by PCR: NEGATIVE
Influenza B by PCR: NEGATIVE
Resp Syncytial Virus by PCR: NEGATIVE
SARS Coronavirus 2 by RT PCR: NEGATIVE

## 2023-06-25 LAB — TROPONIN I (HIGH SENSITIVITY)
Troponin I (High Sensitivity): <2 ng/L (ref <18)
Troponin I (High Sensitivity): <2 ng/L (ref <18)

## 2023-06-25 MED ORDER — KETOROLAC TROMETHAMINE 15 MG/ML IJ SOLN
15.0000 mg | Freq: Once | INTRAMUSCULAR | Status: AC
  Administered 2023-06-25: 15 mg via INTRAVENOUS
  Filled 2023-06-25: qty 1

## 2023-06-25 MED ORDER — IOHEXOL 350 MG/ML SOLN
60.0000 mL | Freq: Once | INTRAVENOUS | Status: AC | PRN
  Administered 2023-06-25: 60 mL via INTRAVENOUS

## 2023-06-25 MED ORDER — PANTOPRAZOLE SODIUM 40 MG IV SOLR
40.0000 mg | Freq: Once | INTRAVENOUS | Status: AC
  Administered 2023-06-25: 40 mg via INTRAVENOUS
  Filled 2023-06-25: qty 10

## 2023-06-25 MED ORDER — SODIUM CHLORIDE 0.9 % IV BOLUS
1000.0000 mL | Freq: Once | INTRAVENOUS | Status: AC
  Administered 2023-06-25: 1000 mL via INTRAVENOUS

## 2023-06-25 NOTE — ED Provider Notes (Signed)
Care assumed of patient from outgoing provider.  See their note for initial history, exam and plan.  Clinical Course as of 06/25/23 0813  Sat Jun 25, 2023  0656 2 days ago with upper and lower endoscopy.   Now with cp w/o sob.  Syncope at home prior to arrival. CTA chest pending. IVF and PPI. Preg negative.  [SM]    Clinical Course User Index [SM] Corena Herter, MD   CT negative.  On reevaluation patient states she is feeling much better.  Patient discharged home with follow-up with primary care provider.  Given return precautions.   Corena Herter, MD 06/25/23 [phone]

## 2023-06-25 NOTE — ED Provider Notes (Signed)
[facility] Provider Note    Event Date/Time   First MD Initiated Contact with Patient 06/25/23 [phone]     (approximate)   History   Chief Complaint: Chest Pain (Pt is 2 days post colonoscopy and endoscopy. Pt c/o CP that started after procedures. Pt denies abd pain or abnormal bowel/bladder. Pts chest pain is currently a 4/10. )   HPI  Brittany Robinson is a 22 y.o. female who complains of anterior chest soreness radiating to the left upper back, started 1 day ago.  2 days ago she had upper and lower endoscopy.  Chest pain has been constant, no aggravating alleviating factors.  Not pleuritic.  Denies shortness of breath or fever.  She has been feeling fatigued, and earlier today while walking at home she passed out briefly.  No head injury.     Physical Exam   Triage Vital Signs: ED Triage Vitals  Encounter Vitals Group     BP 06/25/23 0037 114/73     Systolic BP Percentile --      Diastolic BP Percentile --      Pulse Rate 06/25/23 0035 97     Resp 06/25/23 0035 16     Temp 06/25/23 0410 97.7 F (36.5 C)     Temp src --      SpO2 06/25/23 0035 96 %     Weight 06/25/23 0038 116 lb (52.6 kg)     Height 06/25/23 0038 5\' 3"  (1.6 m)     Head Circumference --      Peak Flow --      Pain Score 06/25/23 0037 5     Pain Loc --      Pain Education --      Exclude from Growth Chart --     Most recent vital signs: Vitals:   06/25/23 0700 06/25/23 0715  BP: 108/72   Pulse: (!) 57 67  Resp:    Temp:    SpO2: 100% 100%    General: Awake, no distress.  CV:  Good peripheral perfusion.  Regular rate and rhythm Resp:  Normal effort. Ctab. No wheezing/crackles Abd:  No distention. Soft, nt. Other:  No lower extremity edema or calf tenderness.  Symmetric calf circumference.  No neck crepitus.   ED Results / Procedures / Treatments   Labs (all labs ordered are listed, but only abnormal results are displayed) Labs Reviewed  BASIC METABOLIC PANEL -  Abnormal; Notable for the following components:      Result Value   Potassium 3.4 (*)    Glucose, Bld 130 (*)    All other components within normal limits  RESP PANEL BY RT-PCR (RSV, FLU A&B, COVID)  RVPGX2  CBC WITH DIFFERENTIAL/PLATELET  POC URINE PREG, ED  TROPONIN I (HIGH SENSITIVITY)  TROPONIN I (HIGH SENSITIVITY)     EKG Interpreted by me Normal sinus rhythm rate of 88.  Normal axis and intervals.  Normal QRS ST segments and T waves.   RADIOLOGY Chest x-ray interpreted by me, appears normal.  Radiology report reviewed   PROCEDURES:  Procedures   MEDICATIONS ORDERED IN ED: Medications  iohexol (OMNIPAQUE) 350 MG/ML injection 60 mL (has no administration in time range)  sodium chloride 0.9 % bolus 1,000 mL (1,000 mLs Intravenous New Bag/Given 06/25/23 0656)  ketorolac (TORADOL) 15 MG/ML injection 15 mg (15 mg Intravenous Given 06/25/23 0657)  pantoprazole (PROTONIX) injection 40 mg (40 mg Intravenous Given 06/25/23 0701)     IMPRESSION / MDM /  ASSESSMENT AND PLAN / ED COURSE  I reviewed the triage vital signs and the nursing notes.  DDx: GERD, dehydration, electrolyte abnormality, pulmonary embolism, esophageal rupture  Patient's presentation is most consistent with acute presentation with potential threat to life or bodily function.     Clinical Course as of 06/25/23 0726  Sat Jun 25, 2023  0656 2 days ago with upper and lower endoscopy.   Now with cp w/o sob.  Syncope at home prior to arrival. CTA chest pending. IVF and PPI. Preg negative.  [SM]    Clinical Course User Index [SM] Corena Herter, MD     FINAL CLINICAL IMPRESSION(S) / ED DIAGNOSES   Final diagnoses:  Nonspecific chest pain     Rx / DC Orders   ED Discharge Orders     None        Note:  This document was prepared using Dragon voice recognition software and may include unintentional dictation errors.   Sharman Cheek, MD 06/25/23 [phone]

## 2023-06-25 NOTE — ED Triage Notes (Signed)
Pt states she has had chest soreness and generalized body aches since earlier today. Per mom pt had syncopal episode earlier tonight after walking in her room.Pt a&o x4 at this time

## 2023-06-25 NOTE — ED Notes (Signed)
Pt returned from CT °

## 2023-06-25 NOTE — ED Notes (Signed)
Pt went to CT

## 2023-07-20 ENCOUNTER — Encounter: Payer: Self-pay | Admitting: Licensed Clinical Social Worker

## 2023-07-20 ENCOUNTER — Inpatient Hospital Stay: Payer: 59

## 2023-07-20 ENCOUNTER — Inpatient Hospital Stay: Payer: 59 | Attending: Oncology | Admitting: Licensed Clinical Social Worker

## 2023-07-20 DIAGNOSIS — Z808 Family history of malignant neoplasm of other organs or systems: Secondary | ICD-10-CM

## 2023-07-20 DIAGNOSIS — Z8041 Family history of malignant neoplasm of ovary: Secondary | ICD-10-CM | POA: Diagnosis not present

## 2023-07-20 DIAGNOSIS — Z800 Family history of malignant neoplasm of digestive organs: Secondary | ICD-10-CM | POA: Diagnosis not present

## 2023-07-20 DIAGNOSIS — Z803 Family history of malignant neoplasm of breast: Secondary | ICD-10-CM

## 2023-07-20 NOTE — Progress Notes (Signed)
REFERRING PROVIDER: Schermerhorn, Festus Holts, MD [address] [place],  [place] [address]  PRIMARY PROVIDER:  Marisue Ivan, MD  PRIMARY REASON FOR VISIT:  1. Family history of breast cancer   2. Family history of ovarian cancer   3. Family history of melanoma   4. Family history of thyroid cancer   5. Family history of stomach cancer      HISTORY OF PRESENT ILLNESS:   Brittany Robinson, a 22 y.o. female, was seen for a [place] cancer genetics consultation at the request of Dr. Feliberto Gottron due to a family history of cancer.  Brittany Robinson presents to clinic today to discuss the possibility of a hereditary predisposition to cancer, genetic testing, and to further clarify her future cancer risks, as well as potential cancer risks for family members.   CANCER HISTORY:  Brittany Robinson is a 22 y.o. female with no personal history of cancer.    RISK FACTORS:  Menarche was at age 59.  Ovaries intact: yes.  Hysterectomy: no.  Menopausal status: premenopausal.  HRT use: 0 years. Colonoscopy: yes; normal. Mammogram within the last year: n/a.  Past Medical History:  Diagnosis Date   Asthma     FAMILY HISTORY:  We obtained a detailed, 4-generation family history.  Significant diagnoses are listed below: Family History  Problem Relation Age of Onset   Breast cancer Mother 51       BRCA1/2 neg   Ovarian cancer Mother 60   Melanoma Paternal Uncle    Ovarian cancer Paternal Grandmother        early 25s   Melanoma Paternal Grandfather    Melanoma Other    Thyroid cancer Cousin 17   Osteosarcoma Cousin 2   Brittany Robinson has 2 younger brothers, neither have had cancer.  Brittany Robinson mother had breast cancer at 43-40 and BRCA1/2 testing at the time that was normal. She had early stage ovarian cancer at 46 and had hysterectomy. She is living at 69. Maternal cousin had thyroid cancer at 63. Maternal grandmother's sister died of stomach cancer.  Brittany Robinson father is living at 75. Paternal  uncle had melanoma. Grandfather also had melanoma and is living at 36. Paternal grandmother had ovarian cancer in her early 22s and had hysterectomy, she is living at 59. Maternal first cousin once removed also had melanoma and her daughter died of osteosarcoma at 18.   Brittany Robinson is unaware of previous family history of genetic testing for hereditary cancer risks. There is no reported Ashkenazi Jewish ancestry. There is no known consanguinity.    GENETIC COUNSELING ASSESSMENT: Brittany Robinson is a 22 y.o. female with a family history of young onset breast and ovarian cancer which is somewhat suggestive of a hereditary cancer syndrome and predisposition to cancer. We, therefore, discussed and recommended the following at today's visit.   DISCUSSION: We discussed that approximately 10% of breast cancer is hereditary and about 20% of ovarian cancer is hereditary. Most cases of hereditary breast/ovarian cancer are associated with BRCA1/2 genes, although there are other genes associated with hereditary cancer as well. Cancers and risks are gene specific. It is reassuring that her mother's testing was normal, however her testing was done 9 years ago and may need to be updated. We could find something in Ms. Trawick coming from her mother that was not identified with that testing. She also has cancer on her paternal side of the family that qualifies her for testing. We discussed that testing is beneficial for several reasons  including knowing about cancer risks, identifying potential screening and risk-reduction options that may be appropriate, and to understand if other family members could be at risk for cancer and allow them to undergo genetic testing.   We reviewed the characteristics, features and inheritance patterns of hereditary cancer syndromes. We also discussed genetic testing, including the appropriate family members to test, the process of testing, insurance coverage and turn-around-time for results. We  discussed the implications of a negative, positive and/or variant of uncertain significant result. We recommended Brittany Robinson pursue genetic testing for the [employer] CancerNext-Expanded+RNA gene panel.   Based on Brittany Robinson's family history of cancer, she meets medical criteria for genetic testing. Despite that she meets criteria, she may still have an out of pocket cost.   We discussed that some people do not want to undergo genetic testing due to fear of genetic discrimination.  A federal law called the Genetic Information Non-Discrimination Act (GINA) of 2008 helps protect individuals against genetic discrimination based on their genetic test results.  It impacts both health insurance and employment.  For health insurance, it protects against increased premiums, being kicked off insurance or being forced to take a test in order to be insured.  For employment it protects against hiring, firing and promoting decisions based on genetic test results.  Health status due to a cancer diagnosis is not protected under GINA.  This law does not protect life insurance, disability insurance, or other types of insurance.   PLAN: After considering the risks, benefits, and limitations, Brittany Robinson provided informed consent to pursue genetic testing and the blood sample was sent to [facility] for analysis of the CancerNext-Expanded+RNA panel. Results should be available within approximately 2-3 weeks' time, at which point they will be disclosed by telephone to Brittany Robinson, as will any additional recommendations warranted by these results. Brittany Robinson will receive a summary of her genetic counseling visit and a copy of her results once available. This information will also be available in Epic.   Brittany Robinson questions were answered to her satisfaction today. Our contact information was provided should additional questions or concerns arise. Thank you for the referral and allowing [place] to share in the care of your patient.    Lacy Duverney, MS, [facility] Genetic Counselor [email or web address] Phone: [phone]  The patient was seen for a total of 25 minutes in face-to-face genetic counseling.  Dr. Blake Divine was available for discussion regarding this case.   _______________________________________________________________________ For Office Staff:  Number of people involved in session: 1 Was an Intern/ student involved with case: no

## 2023-08-05 ENCOUNTER — Ambulatory Visit: Payer: Self-pay | Admitting: Licensed Clinical Social Worker

## 2023-08-05 ENCOUNTER — Encounter: Payer: Self-pay | Admitting: Licensed Clinical Social Worker

## 2023-08-05 ENCOUNTER — Telehealth: Payer: Self-pay | Admitting: Licensed Clinical Social Worker

## 2023-08-05 DIAGNOSIS — Z1379 Encounter for other screening for genetic and chromosomal anomalies: Secondary | ICD-10-CM | POA: Insufficient documentation

## 2023-08-05 NOTE — Progress Notes (Signed)
HPI:   Ms. Brittany Robinson was previously seen in the [facility] clinic due to a family history of cancer and concerns regarding a hereditary predisposition to cancer. Please refer to our prior cancer genetics clinic note for more information regarding our discussion, assessment and recommendations, at the time. Brittany Robinson recent genetic test results were disclosed to her, as were recommendations warranted by these results. These results and recommendations are discussed in more detail below.  CANCER HISTORY:  Oncology History   No history exists.    FAMILY HISTORY:  We obtained a detailed, 4-generation family history.  Significant diagnoses are listed below: Family History  Problem Relation Age of Onset   Breast cancer Mother 52       BRCA1/2 neg   Ovarian cancer Mother 53   Melanoma Paternal Uncle    Ovarian cancer Paternal Grandmother        early 42s   Melanoma Paternal Grandfather    Melanoma Other    Thyroid cancer Cousin 17   Osteosarcoma Cousin 53   Brittany Robinson has 2 younger brothers, neither have had cancer.   Brittany Robinson mother had breast cancer at 70-40 and BRCA1/2 testing at the time that was normal. She had early stage ovarian cancer at 40 and had hysterectomy. She is living at 56. Maternal cousin had thyroid cancer at 71. Maternal grandmother's sister died of stomach cancer.   Brittany Robinson father is living at 56. Paternal uncle had melanoma. Grandfather also had melanoma and is living at 43. Paternal grandmother had ovarian cancer in her early 55s and had hysterectomy, she is living at 42. Maternal first cousin once removed also had melanoma and her daughter died of osteosarcoma at 32.    Brittany Robinson is unaware of previous family history of genetic testing for hereditary cancer risks. There is no reported Ashkenazi Jewish ancestry. There is no known consanguinity.      GENETIC TEST RESULTS:  The [facility] CancerNext-Expanded+RNA Panel found no pathogenic mutations.    The CancerNext-Expanded + RNAinsight gene panel offered by [employer] and includes sequencing and rearrangement analysis for the following 71 genes: AIP, ALK, APC*, ATM*, AXIN2, BAP1, BARD1, BMPR1A, BRCA1*, BRCA2*, BRIP1*, CDC73, CDH1*,CDK4, CDKN1B, CDKN2A, CHEK2*, CTNNA1, DICER1, FH, FLCN, KIF1B, LZTR1, MAX, MEN1, MET, MLH1*, MSH2*, MSH3, MSH6*, MUTYH*, NF1*, NF2, NTHL1, PALB2*, PHOX2B, PMS2*, POT1, PRKAR1A, PTCH1, PTEN*, RAD51C*, RAD51D*,RB1, RET, SDHA, SDHAF2, SDHB, SDHC, SDHD, SMAD4, SMARCA4, SMARCB1, SMARCE1, STK11, SUFU, TMEM127, TP53*,TSC1, TSC2, VHL; EGFR, EGLN1, HOXB13, KIT, MITF, PDGFRA, POLD1 and POLE (sequencing only); EPCAM and GREM1 (deletion/duplication only).   The test report has been scanned into EPIC and is located under the Molecular Pathology section of the Results Review tab.  A portion of the result report is included below for reference. Genetic testing reported out on 08/03/2023.      Even though a pathogenic variant was not identified, possible explanations for the cancer in the family may include: There may be no hereditary risk for cancer in the family. The cancers in Brittany Robinson and/or her family may be sporadic/familial or due to other genetic and environmental factors. There may be a gene mutation in one of these genes that current testing methods cannot detect but that chance is small. There could be another gene that has not yet been discovered, or that we have not yet tested, that is responsible for the cancer diagnoses in the family.  It is also possible there is a hereditary cause for the cancer in the family  that Brittany Robinson did not inherit. Therefore, it is important to remain in touch with cancer genetics in the future so that we can continue to offer Brittany Robinson the most up to date genetic testing.   ADDITIONAL GENETIC TESTING:  We discussed with Brittany Robinson that her genetic testing was fairly extensive.  If there are additional relevant genes identified to  increase cancer risk that can be analyzed in the future, we would be happy to discuss and coordinate this testing at that time.    CANCER SCREENING RECOMMENDATIONS:  Brittany Robinson test result is considered negative (normal).  This means that we have not identified a hereditary cause for her family history of cancer at this time.   An individual's cancer risk and medical management are not determined by genetic test results alone. Overall cancer risk assessment incorporates additional factors, including personal medical history, family history, and any available genetic information that may result in a personalized plan for cancer prevention and surveillance. Therefore, it is recommended she continue to follow the cancer management and screening guidelines provided by her primary healthcare provider.  Based on the reported personal and family history, specific cancer screenings for Brittany Robinson and her family include: - Brittany Robinson should consider having a breast cancer risk model, such as Rockne Menghini, run when she is 10 years younger than earliest breast cancer in the family.  RECOMMENDATIONS FOR FAMILY MEMBERS:   Individuals in this family might be at some increased risk of developing cancer, over the general population risk, due to the family history of cancer.  Individuals in the family should notify their providers of the family history of cancer. We recommend women in this family have a yearly mammogram beginning at age 60, or 38 years younger than the earliest onset of cancer, an annual clinical breast exam, and perform monthly breast self-exams.  Family members should have colonoscopies by at age 16, or earlier, as recommended by their providers. Other members of the family may still carry a pathogenic variant in one of these genes that Brittany Robinson did not inherit. Based on the family history, we recommend her mother and paternal relatives have genetic counseling and testing. Brittany Robinson will let  us know if we can be of any assistance in coordinating genetic counseling and/or testing for this family member.   FOLLOW-UP:  Lastly, we discussed with Brittany Robinson that cancer genetics is a rapidly advancing field and it is possible that new genetic tests will be appropriate for her and/or her family members in the future. We encouraged her to remain in contact with cancer genetics on an annual basis so we can update her personal and family histories and let her know of advances in cancer genetics that may benefit this family.   Our contact number was provided. Brittany Robinson questions were answered to her satisfaction, and she knows she is welcome to call us at anytime with additional questions or concerns.    Lacy Duverney, MS, [facility] Genetic Counselor [email or web address] Phone: [phone]

## 2023-08-05 NOTE — Telephone Encounter (Signed)
I contacted [name] to discuss her genetic testing results. No pathogenic variants were identified in the 71 genes analyzed. Detailed clinic note to follow.   The test report has been scanned into EPIC and is located under the Molecular Pathology section of the Results Review tab.  A portion of the result report is included below for reference.     Brittany Duverney, MS, [facility] Genetic Counselor [email or web address] Phone: [phone]

## 2024-09-07 ENCOUNTER — Ambulatory Visit
Admission: RE | Admit: 2024-09-07 | Discharge: 2024-09-07 | Disposition: A | Discharge status: Home / Self Care | Source: Ambulatory Visit

## 2024-09-07 VITALS — BP 115/82 | HR 84 | Temp 98.6°F | Resp 14 | Ht 63.0 in | Wt 116.0 lb

## 2024-09-07 DIAGNOSIS — J029 Acute pharyngitis, unspecified: Secondary | ICD-10-CM | POA: Diagnosis not present

## 2024-09-07 DIAGNOSIS — R0981 Nasal congestion: Secondary | ICD-10-CM | POA: Diagnosis not present

## 2024-09-07 DIAGNOSIS — J069 Acute upper respiratory infection, unspecified: Secondary | ICD-10-CM | POA: Diagnosis not present

## 2024-09-07 DIAGNOSIS — H66001 Acute suppurative otitis media without spontaneous rupture of ear drum, right ear: Secondary | ICD-10-CM | POA: Diagnosis not present

## 2024-09-07 MED ORDER — AMOXICILLIN-POT CLAVULANATE 875-125 MG PO TABS: 1.0000 | ORAL_TABLET | Freq: Two times a day (BID) | ORAL | 0 refills | Status: AC

## 2024-09-07 MED ORDER — IPRATROPIUM BROMIDE 0.06 % NA SOLN: 2.0000 | Freq: Four times a day (QID) | NASAL | 12 refills | Status: AC

## 2024-09-07 MED ORDER — BENZONATATE 100 MG PO CAPS: 200.0000 mg | ORAL_CAPSULE | Freq: Three times a day (TID) | ORAL | 0 refills | Status: AC

## 2024-09-07 MED ORDER — PROMETHAZINE-DM 6.25-15 MG/5ML PO SYRP: 5.0000 mL | ORAL_SOLUTION | Freq: Four times a day (QID) | ORAL | 0 refills | Status: AC | PRN

## 2024-09-07 NOTE — Discharge Instructions (Signed)
 Take the Augmentin twice daily for 7 days with food for treatment of your ear infection.  Take an over-the-counter probiotic 1 hour after each dose of antibiotic to prevent diarrhea.  Use over-the-counter Tylenol and ibuprofen as needed for pain or fever.  Place a hot water bottle, or heating pad, underneath your pillowcase at night to help dilate up your ear and aid in pain relief as well as resolution of the infection.  Use the Atrovent nasal spray, 2 squirts in each nostril every 6 hours, as needed for runny nose and postnasal drip.  Use the Tessalon Perles every 8 hours during the day.  Take them with a small sip of water.  They may give you some numbness to the base of your tongue or a metallic taste in your mouth, this is normal.  Use the Promethazine DM cough syrup at bedtime for cough and congestion.  It will make you drowsy so do not take it during the day.  Return for reevaluation or see your primary care provider for any new or worsening symptoms.

## 2024-09-07 NOTE — ED Provider Notes (Signed)
 [facility]    CSN: [id number] Arrival date & time: 09/07/24  1409      History   Chief Complaint Chief Complaint  Patient presents with   Nasal Congestion    Appointment    HPI Brittany Robinson is a 23 y.o. female.   HPI  23 year old female with past medical history significant for asthma presents for evaluation of 1 week worth of headache, nasal congestion, right ear pain, and cough that is productive for a green sputum.  No shortness of breath or wheezing.  Past Medical History:  Diagnosis Date   Asthma     Patient Active Problem List   Diagnosis Date Noted   Genetic testing 08/05/2023    History reviewed. No pertinent surgical history.  OB History   No obstetric history on file.      Home Medications    Prior to Admission medications   Medication Sig Start Date End Date Taking? Authorizing Provider  amoxicillin-clavulanate (AUGMENTIN) 875-125 MG tablet Take 1 tablet by mouth every 12 (twelve) hours for 7 days. 09/07/24 09/14/24 Yes Bernardino Ditch, NP  benzonatate (TESSALON) 100 MG capsule Take 2 capsules (200 mg total) by mouth every 8 (eight) hours. 09/07/24  Yes Bernardino Ditch, NP  ipratropium (ATROVENT) 0.06 % nasal spray Place 2 sprays into both nostrils 4 (four) times daily. 09/07/24  Yes Bernardino Ditch, NP  promethazine-dextromethorphan (PROMETHAZINE-DM) 6.25-15 MG/5ML syrup Take 5 mLs by mouth 4 (four) times daily as needed. 09/07/24  Yes Bernardino Ditch, NP  dicyclomine  (BENTYL ) 10 MG capsule Take 1 capsule (10 mg total) by mouth 3 (three) times daily as needed for spasms (abdominal pain). 10/28/22   Gordan Huxley, MD  Drospirenone-Ethinyl Estradiol-Levomefol (BEYAZ) 3-0.02-0.451 MG tablet Take 1 tablet by mouth daily.    [provider]    Family History Family History  Problem Relation Age of Onset   Breast cancer Mother 23       BRCA1/2 neg   Ovarian cancer Mother 85   Melanoma Paternal Uncle    Ovarian cancer Paternal Grandmother         early 68s   Melanoma Paternal Grandfather    Melanoma Other    Thyroid cancer Cousin 17   Osteosarcoma Cousin 24    Social History Social History   Tobacco Use   Smoking status: Former    Current packs/day: 0.25    Types: Cigarettes   Smokeless tobacco: Never  Vaping Use   Vaping status: Every Day  Substance Use Topics   Alcohol use: Yes    Comment: occasional   Drug use: Never     Allergies   Patient has no known allergies.   Review of Systems Review of Systems  Constitutional:  Negative for fever.  HENT:  Positive for congestion, ear pain and rhinorrhea.   Respiratory:  Positive for cough. Negative for shortness of breath and wheezing.      Physical Exam Triage Vital Signs ED Triage Vitals  Encounter Vitals Group     BP      Girls Systolic BP Percentile      Girls Diastolic BP Percentile      Boys Systolic BP Percentile      Boys Diastolic BP Percentile      Pulse      Resp      Temp      Temp src      SpO2      Weight      Height  Head Circumference      Peak Flow      Pain Score      Pain Loc      Pain Education      Exclude from Growth Chart    No data found.  Updated Vital Signs BP 115/82 (BP Location: Right Arm)   Pulse 84   Temp 98.6 F (37 C) (Oral)   Resp 14   Ht 5' 3 (1.6 m)   Wt 115 lb 15.4 oz (52.6 kg)   LMP 09/06/2024 (Exact Date)   SpO2 98%   BMI 20.54 kg/m   Visual Acuity Right Eye Distance:   Left Eye Distance:   Bilateral Distance:    Right Eye Near:   Left Eye Near:    Bilateral Near:     Physical Exam Vitals and nursing note reviewed.  Constitutional:      Appearance: Normal appearance. She is not ill-appearing.  HENT:     Head: Normocephalic and atraumatic.     Right Ear: Ear canal and external ear normal. There is no impacted cerumen.     Left Ear: Tympanic membrane, ear canal and external ear normal. There is no impacted cerumen.     Ears:     Comments: Right tympanic membrane is  erythematous and retracted.  Left TM is pretty gray appearance.  Both EACs are clear.    Nose: Congestion and rhinorrhea present.     Comments: These mucosa is edematous and erythematous with yellow discharge in both nares.    Mouth/Throat:     Mouth: Mucous membranes are moist.     Pharynx: Oropharynx is clear. No oropharyngeal exudate or posterior oropharyngeal erythema.  Cardiovascular:     Rate and Rhythm: Normal rate and regular rhythm.     Pulses: Normal pulses.     Heart sounds: Normal heart sounds. No murmur heard.    No friction rub. No gallop.  Pulmonary:     Effort: Pulmonary effort is normal.     Breath sounds: Normal breath sounds. No wheezing, rhonchi or rales.  Musculoskeletal:     Cervical back: Normal range of motion and neck supple. No tenderness.  Lymphadenopathy:     Cervical: No cervical adenopathy.  Skin:    General: Skin is warm and dry.     Capillary Refill: Capillary refill takes less than 2 seconds.     Findings: No rash.  Neurological:     General: No focal deficit present.     Mental Status: She is alert and oriented to person, place, and time.      UC Treatments / Results  Labs (all labs ordered are listed, but only abnormal results are displayed) Labs Reviewed  POCT RAPID STREP A (OFFICE)  POC SOFIA SARS ANTIGEN FIA    EKG   Radiology No results found.  Procedures Procedures (including critical care time)  Medications Ordered in UC Medications - No data to display  Initial Impression / Assessment and Plan / UC Course  I have reviewed the triage vital signs and the nursing notes.  Pertinent labs & imaging results that were available during my care of the patient were reviewed by me and considered in my medical decision making (see chart for details).   Patient is a very pleasant, nontoxic-appearing 23 year old female presenting for evaluation of 1 week worth of respiratory symptoms as outlined HPI above.  Her physical exam reveals the  presence of otitis media on on the right and the presence of an upper  respiratory tract infection.  I will discharge her home on Augmentin 875 twice daily with food for a week along with Atrovent nasal spray to help with the nasal congestion and you can use 4 times a day.  Tessalon Perles for cough today and Promethazine DM cough syrup for bedtime.  Return precautions reviewed.  Work note provided.   Final Clinical Impressions(s) / UC Diagnoses   Final diagnoses:  Acute pharyngitis, unspecified etiology  Nasal congestion  URI with cough and congestion  Non-recurrent acute suppurative otitis media of right ear without spontaneous rupture of tympanic membrane     Discharge Instructions      Take the Augmentin twice daily for 7 days with food for treatment of your ear infection.  Take an over-the-counter probiotic 1 hour after each dose of antibiotic to prevent diarrhea.  Use over-the-counter Tylenol  and ibuprofen as needed for pain or fever.  Place a hot water bottle, or heating pad, underneath your pillowcase at night to help dilate up your ear and aid in pain relief as well as resolution of the infection.  Use the Atrovent nasal spray, 2 squirts in each nostril every 6 hours, as needed for runny nose and postnasal drip.  Use the Tessalon Perles every 8 hours during the day.  Take them with a small sip of water.  They may give you some numbness to the base of your tongue or a metallic taste in your mouth, this is normal.  Use the Promethazine DM cough syrup at bedtime for cough and congestion.  It will make you drowsy so do not take it during the day.  Return for reevaluation or see your primary care provider for any new or worsening symptoms.      ED Prescriptions     Medication Sig Dispense Auth. Provider   amoxicillin-clavulanate (AUGMENTIN) 875-125 MG tablet Take 1 tablet by mouth every 12 (twelve) hours for 7 days. 14 tablet Bernardino Ditch, NP   benzonatate (TESSALON) 100 MG  capsule Take 2 capsules (200 mg total) by mouth every 8 (eight) hours. 21 capsule Bernardino Ditch, NP   ipratropium (ATROVENT) 0.06 % nasal spray Place 2 sprays into both nostrils 4 (four) times daily. 15 mL Bernardino Ditch, NP   promethazine-dextromethorphan (PROMETHAZINE-DM) 6.25-15 MG/5ML syrup Take 5 mLs by mouth 4 (four) times daily as needed. 118 mL Bernardino Ditch, NP      PDMP not reviewed this encounter.   Bernardino Ditch, NP 09/07/24 [id number]

## 2024-09-07 NOTE — ED Triage Notes (Signed)
 Patient c/o cough, nasal congestion, headache, and right ear pain that started a week ago.  Patient denies fevers.
